# Patient Record
Sex: Female | Born: 1985 | Race: White | Hispanic: No | Marital: Married | Smoking: Never smoker
Health system: Southern US, Community
[De-identification: ages and names within clinical notes are randomized; demographics above are authoritative.]

## PROBLEM LIST (undated history)

## (undated) DIAGNOSIS — R55 Syncope and collapse: Secondary | ICD-10-CM

---

## 2017-09-01 ENCOUNTER — Emergency Department (HOSPITAL_BASED_OUTPATIENT_CLINIC_OR_DEPARTMENT_OTHER)
Admission: EM | Admit: 2017-09-01 | Discharge: 2017-09-01 | Disposition: A | Discharge status: Home / Self Care | Payer: BLUE CROSS/BLUE SHIELD | Attending: Emergency Medicine | Admitting: Emergency Medicine

## 2017-09-01 ENCOUNTER — Other Ambulatory Visit: Payer: Self-pay

## 2017-09-01 ENCOUNTER — Encounter (HOSPITAL_BASED_OUTPATIENT_CLINIC_OR_DEPARTMENT_OTHER): Payer: Self-pay | Admitting: Emergency Medicine

## 2017-09-01 DIAGNOSIS — R55 Syncope and collapse: Secondary | ICD-10-CM | POA: Diagnosis not present

## 2017-09-01 DIAGNOSIS — R51 Headache: Secondary | ICD-10-CM | POA: Diagnosis not present

## 2017-09-01 DIAGNOSIS — E876 Hypokalemia: Secondary | ICD-10-CM | POA: Diagnosis not present

## 2017-09-01 DIAGNOSIS — R42 Dizziness and giddiness: Secondary | ICD-10-CM | POA: Diagnosis present

## 2017-09-01 HISTORY — DX: Syncope and collapse: R55

## 2017-09-01 LAB — BASIC METABOLIC PANEL WITH GFR
Anion gap: 5 (ref 5–15)
BUN: 6 mg/dL (ref 6–20)
CO2: 26 mmol/L (ref 22–32)
Calcium: 8.6 mg/dL — ABNORMAL LOW (ref 8.9–10.3)
Chloride: 105 mmol/L (ref 101–111)
Creatinine, Ser: 0.55 mg/dL (ref 0.44–1.00)
GFR calc Af Amer: >60 mL/min
GFR calc non Af Amer: >60 mL/min
Glucose, Bld: 84 mg/dL (ref 65–99)
Potassium: 3.1 mmol/L — ABNORMAL LOW (ref 3.5–5.1)
Sodium: 136 mmol/L (ref 135–145)

## 2017-09-01 LAB — CBC
HEMATOCRIT: 37.6 % (ref 36.0–46.0)
Hemoglobin: 12.9 g/dL (ref 12.0–15.0)
MCH: 28.7 pg (ref 26.0–34.0)
MCHC: 34.3 g/dL (ref 30.0–36.0)
MCV: 83.6 fL (ref 78.0–100.0)
Platelets: 150 10*3/uL (ref 150–400)
RBC: 4.5 MIL/uL (ref 3.87–5.11)
RDW: 13.1 % (ref 11.5–15.5)
WBC: 3.1 10*3/uL — ABNORMAL LOW (ref 4.0–10.5)

## 2017-09-01 LAB — PREGNANCY, URINE: Preg Test, Ur: NEGATIVE

## 2017-09-01 MED ORDER — POTASSIUM CHLORIDE CRYS ER 20 MEQ PO TBCR: 40.0000 meq | EXTENDED_RELEASE_TABLET | Freq: Once | ORAL | Status: DC

## 2017-09-01 NOTE — ED Triage Notes (Signed)
Pt states she has been having some dizziness episodes for several days, near syncopal episode two days ago.  Pt states she feels fine today but want to have it checked out.

## 2017-09-01 NOTE — Discharge Instructions (Signed)
Follow-up with a primary care doctor for further evaluation as we discussed, return as needed for worsening symptoms, try eating foods that are high in potassium

## 2017-09-01 NOTE — ED Notes (Signed)
Pt left department prior to potassium administration.  Md notified.  Called in one dose of potassium to [facility] on [address].  Pt will pick up

## 2017-09-01 NOTE — ED Provider Notes (Signed)
[facility] EMERGENCY DEPARTMENT Provider Note   CSN: [id number] Arrival date & time: 09/01/17  [id number]     History   Chief Complaint Chief Complaint  Patient presents with  . Dizziness    HPI Teresa Baker is a 31 y.o. female.  HPI Patient presents to the emergency room for evaluation of dizziness.  Patient states she had an episode a couple of days ago.  She was at work.  She was feeling somewhat stressed.  She started to feel lightheaded as if she was going to pass out.  Patient's had these symptoms in the past when she was younger so she was not too concerned initially.  She sat down and waited for the symptoms to pass.  What was unusual is that she developed a headache after the episode and it took longer for her symptoms to resolve.  She did end up feeling better and was doing well for the last couple of days.  She did have another where she felt lightheaded today so she decided to get checked out.  Patient also notes that she took her blood pressure during the episode and it was elevated at 160 systolic.  Patient went to an urgent care to get evaluated today and they suggested she come to the emergency room she denies any trouble with headache numbness or weakness.  She is not having any fevers or chills.  No vomiting or diarrhea. Past Medical History:  Diagnosis Date  . Fainting     There are no active problems to display for this patient.   Social history: Patient denies drug use  OB History    No data available       Home Medications    Prior to Admission medications   Not on File    Family History Family History  Problem Relation Age of Onset  . Hypertension Father     Social History Social History   Tobacco Use  . Smoking status: Never Smoker  . Smokeless tobacco: Never Used  Substance Use Topics  . Alcohol use: Not on file  . Drug use: Not on file     Allergies   Phenergan [promethazine hcl]   Review of Systems Review of Systems    All other systems reviewed and are negative.    Physical Exam Updated Vital Signs BP (!) 121/94   Pulse 87   Temp 97.8 F (36.6 C)   Resp 16   Ht 1.524 m (5')   Wt 56.7 kg (125 lb)   LMP 08/27/2017   SpO2 100%   BMI 24.41 kg/m   Physical Exam  Constitutional: She appears well-developed and well-nourished. No distress.  HENT:  Head: Normocephalic and atraumatic.  Right Ear: External ear normal.  Left Ear: External ear normal.  Eyes: Conjunctivae are normal. Right eye exhibits no discharge. Left eye exhibits no discharge. No scleral icterus.  Neck: Neck supple. No tracheal deviation present.  Cardiovascular: Normal rate, regular rhythm and intact distal pulses.  Pulmonary/Chest: Effort normal and breath sounds normal. No stridor. No respiratory distress. She has no wheezes. She has no rales.  Abdominal: Soft. Bowel sounds are normal. She exhibits no distension. There is no tenderness. There is no rebound and no guarding.  Musculoskeletal: She exhibits no edema or tenderness.  Neurological: She is alert. She has normal strength. No cranial nerve deficit (no facial droop, extraocular movements intact, no slurred speech) or sensory deficit. She exhibits normal muscle tone. She displays no seizure activity. Coordination normal.  Skin: Skin is warm and dry. No rash noted.  Psychiatric: She has a normal mood and affect.  Nursing note and vitals reviewed.    ED Treatments / Results  Labs (all labs ordered are listed, but only abnormal results are displayed) Labs Reviewed  CBC - Abnormal; Notable for the following components:      Result Value   WBC 3.1 (*)    All other components within normal limits  BASIC METABOLIC PANEL - Abnormal; Notable for the following components:   Potassium 3.1 (*)    Calcium 8.6 (*)    All other components within normal limits  PREGNANCY, URINE    EKG  EKG Interpretation  Date/Time:  Friday September 01 2017 09:31:33 EST Ventricular Rate:   78 PR Interval:    QRS Duration: 102 QT Interval:  394 QTC Calculation: 449 R Axis:   90 Text Interpretation:  Sinus rhythm Borderline right axis deviation No old tracing to compare Confirmed by Linwood DibblesKnapp, Landra Howze [phone] on 09/01/2017 9:38:04 AM       Radiology No results found.  Procedures Procedures (including critical care time)  Medications Ordered in ED Medications  potassium chloride SA (K-DUR,KLOR-CON) CR tablet 40 mEq (not administered)     Initial Impression / Assessment and Plan / ED Course  I have reviewed the triage vital signs and the nursing notes.  Pertinent labs & imaging results that were available during my care of the patient were reviewed by me and considered in my medical decision making (see chart for details).   Patient presented to the emergency room for evaluation of a near syncopal episode the other day.  Patient states she has had these symptoms in the past.  Physical exam is unremarkable.  She is asymptomatic.  Her laboratory tests are notable for hypokalemia.  That is most likely not clinically related however I will give her a dose of potassium and have her follow-up with her primary care doctor to have that rechecked.  At this time there does not appear to be any evidence of an acute emergency medical condition and the patient appears stable for discharge with appropriate outpatient follow up.   Final Clinical Impressions(s) / ED Diagnoses   Final diagnoses:  Hypokalemia  Near syncope    ED Discharge Orders    None       Linwood DibblesKnapp, Cobi Delph, MD 09/01/17 1126

## 2019-09-17 ENCOUNTER — Ambulatory Visit: Payer: Managed Care, Other (non HMO) | Attending: Internal Medicine

## 2019-09-17 DIAGNOSIS — Z20822 Contact with and (suspected) exposure to covid-19: Secondary | ICD-10-CM

## 2021-08-09 ENCOUNTER — Ambulatory Visit: Payer: Self-pay

## 2021-08-09 ENCOUNTER — Ambulatory Visit (INDEPENDENT_AMBULATORY_CARE_PROVIDER_SITE_OTHER): Payer: Managed Care, Other (non HMO) | Admitting: Family Medicine

## 2021-08-09 ENCOUNTER — Encounter: Payer: Self-pay | Admitting: Family Medicine

## 2021-08-09 VITALS — BP 108/78 | Ht 60.0 in | Wt 119.0 lb

## 2021-08-09 DIAGNOSIS — S92214A Nondisplaced fracture of cuboid bone of right foot, initial encounter for closed fracture: Secondary | ICD-10-CM | POA: Diagnosis not present

## 2021-08-09 DIAGNOSIS — M25571 Pain in right ankle and joints of right foot: Secondary | ICD-10-CM

## 2021-08-09 NOTE — Patient Instructions (Signed)
Nice to meet you Please use the boot  Please try ice as needed   Please send me a message in MyChart with any questions or updates.  Please see me back in 2 weeks.   --Dr. Jordan Likes

## 2021-08-09 NOTE — Assessment & Plan Note (Signed)
Findings most consistent with a cuboid fracture. -Counseled on home exercise therapy and supportive care. -Cam walker and crutches. -Follow-up in 2 weeks.

## 2021-08-09 NOTE — Progress Notes (Signed)
  Legacy Carrender - 35 y.o. female MRN [id number]  Date of birth: 12/03/1985  SUBJECTIVE:  Including CC & ROS.  No chief complaint on file.   Teresa Baker is a 35 y.o. female that is presenting with acute right foot pain.  She had an inversion injury a few days ago.  Since that time she has had pain and swelling over the lateral midfoot.  Has been using ice.  Independent review of the right foot fracture suggest an avulsion of the cuboid   Review of Systems See HPI   HISTORY: Past Medical, Surgical, Social, and Family History Reviewed & Updated per EMR.   Pertinent Historical Findings include:  Past Medical History:  Diagnosis Date   Fainting     History reviewed. No pertinent surgical history.  Family History  Problem Relation Age of Onset   Hypertension Father     Social History   Socioeconomic History   Marital status: Married    Spouse name: Not on file   Number of children: Not on file   Years of education: Not on file   Highest education level: Not on file  Occupational History   Not on file  Tobacco Use   Smoking status: Never   Smokeless tobacco: Never  Substance and Sexual Activity   Alcohol use: Not on file   Drug use: Not on file   Sexual activity: Not on file  Other Topics Concern   Not on file  Social History Narrative   Not on file   Social Determinants of Health   Financial Resource Strain: Not on file  Food Insecurity: Not on file  Transportation Needs: Not on file  Physical Activity: Not on file  Stress: Not on file  Social Connections: Not on file  Intimate Partner Violence: Not on file     PHYSICAL EXAM:  VS: BP 108/78   Ht 5' (1.524 m)   Wt 119 lb (54 kg)   BMI 23.24 kg/m  Physical Exam Gen: NAD, alert, cooperative with exam, well-appearing   Limited ultrasound: Right foot:  Normal-appearing peroneal tendons at the lateral malleolus. Normal-appearing base of the fifth metatarsal. Normal insertional peroneal brevis base into the  base of the fifth metatarsal. Increased hyperemia over the lateral aspect of the cuboid to suggest fracture.  Summary: Findings consistent with nondisplaced cuboid fracture.  Ultrasound and interpretation by Clare Gandy, MD     ASSESSMENT & PLAN:   Closed nondisplaced fracture of cuboid bone of right foot Findings most consistent with a cuboid fracture. -Counseled on home exercise therapy and supportive care. -Cam walker and crutches. -Follow-up in 2 weeks.

## 2021-08-23 ENCOUNTER — Ambulatory Visit: Payer: Managed Care, Other (non HMO) | Admitting: Family Medicine

## 2021-08-24 ENCOUNTER — Ambulatory Visit (INDEPENDENT_AMBULATORY_CARE_PROVIDER_SITE_OTHER): Payer: Managed Care, Other (non HMO) | Admitting: Family Medicine

## 2021-08-24 ENCOUNTER — Encounter: Payer: Self-pay | Admitting: Family Medicine

## 2021-08-24 ENCOUNTER — Ambulatory Visit (HOSPITAL_BASED_OUTPATIENT_CLINIC_OR_DEPARTMENT_OTHER)
Admission: RE | Admit: 2021-08-24 | Discharge: 2021-08-24 | Disposition: A | Discharge status: Home / Self Care | Payer: Managed Care, Other (non HMO) | Source: Ambulatory Visit | Attending: Family Medicine | Admitting: Family Medicine

## 2021-08-24 ENCOUNTER — Other Ambulatory Visit: Payer: Self-pay

## 2021-08-24 DIAGNOSIS — S92214D Nondisplaced fracture of cuboid bone of right foot, subsequent encounter for fracture with routine healing: Secondary | ICD-10-CM | POA: Insufficient documentation

## 2021-08-24 NOTE — Patient Instructions (Signed)
Good to see you Please continue the boot  Please use ice as needed  I will call with the results   Please send me a message in MyChart with any questions or updates.  Please see me back in 4 weeks.   --Dr. Jordan Likes

## 2021-08-24 NOTE — Assessment & Plan Note (Addendum)
Having improvement with CAM walker -Counseled on home exercise therapy and supportive care. -X-ray. -Could consider physical therapy.

## 2021-08-24 NOTE — Progress Notes (Signed)
  Teresa Baker - 35 y.o. female MRN [id number]  Date of birth: 09-15-1986  SUBJECTIVE:  Including CC & ROS.  No chief complaint on file.   Teresa Baker is a 35 y.o. female that is following up for her right foot fracture.  She has been doing well with the cam walker.  Denies any significant swelling and only minimal pain.   Review of Systems See HPI   HISTORY: Past Medical, Surgical, Social, and Family History Reviewed & Updated per EMR.   Pertinent Historical Findings include:  Past Medical History:  Diagnosis Date   Fainting     History reviewed. No pertinent surgical history.  Family History  Problem Relation Age of Onset   Hypertension Father     Social History   Socioeconomic History   Marital status: Married    Spouse name: Not on file   Number of children: Not on file   Years of education: Not on file   Highest education level: Not on file  Occupational History   Not on file  Tobacco Use   Smoking status: Never   Smokeless tobacco: Never  Substance and Sexual Activity   Alcohol use: Not on file   Drug use: Not on file   Sexual activity: Not on file  Other Topics Concern   Not on file  Social History Narrative   Not on file   Social Determinants of Health   Financial Resource Strain: Not on file  Food Insecurity: Not on file  Transportation Needs: Not on file  Physical Activity: Not on file  Stress: Not on file  Social Connections: Not on file  Intimate Partner Violence: Not on file     PHYSICAL EXAM:  VS: BP 98/60 (BP Location: Left Arm, Patient Position: Sitting)   Ht 5' (1.524 m)   Wt 119 lb (54 kg)   BMI 23.24 kg/m  Physical Exam Gen: NAD, alert, cooperative with exam, well-appearing    ASSESSMENT & PLAN:   Closed nondisplaced fracture of cuboid bone of right foot Having improvement with CAM walker -Counseled on home exercise therapy and supportive care. -X-ray. -Could consider physical therapy.

## 2021-08-25 ENCOUNTER — Telehealth: Payer: Self-pay | Admitting: Family Medicine

## 2021-08-25 NOTE — Telephone Encounter (Signed)
Pt informed of below.  

## 2021-08-25 NOTE — Telephone Encounter (Signed)
Left VM for patient. If she calls back please have her speak with a nurse/CMA and inform that the xrays shows the fracture so continue with CAM walker for 4 weeks. .   If any questions then please take the best time and phone number to call and I will try to call her back.   Myra Rude, MD [facility] 08/25/2021, 2:17 PM

## 2021-09-21 ENCOUNTER — Ambulatory Visit (INDEPENDENT_AMBULATORY_CARE_PROVIDER_SITE_OTHER): Payer: Managed Care, Other (non HMO) | Admitting: Family Medicine

## 2021-09-21 ENCOUNTER — Encounter: Payer: Self-pay | Admitting: Family Medicine

## 2021-09-21 DIAGNOSIS — S92214D Nondisplaced fracture of cuboid bone of right foot, subsequent encounter for fracture with routine healing: Secondary | ICD-10-CM

## 2021-09-21 NOTE — Patient Instructions (Signed)
Good to see you Please use ice as needed  Please stop the boot  Please try the exercises   Please send me a message in MyChart with any questions or updates.  Please see me back as needed .   --Dr. Raeford Razor

## 2021-09-21 NOTE — Progress Notes (Signed)
°  Teresa Baker - 36 y.o. female MRN [id number]  Date of birth: 07-14-1986  SUBJECTIVE:  Including CC & ROS.  No chief complaint on file.   Teresa Baker is a 36 y.o. female that is following up for her right foot fracture.  She has discontinued the boot and the foot has been doing well.  Denies any pain or swelling today.   Review of Systems See HPI   HISTORY: Past Medical, Surgical, Social, and Family History Reviewed & Updated per EMR.   Pertinent Historical Findings include:  Past Medical History:  Diagnosis Date   Fainting     History reviewed. No pertinent surgical history.  Family History  Problem Relation Age of Onset   Hypertension Father     Social History   Socioeconomic History   Marital status: Married    Spouse name: Not on file   Number of children: Not on file   Years of education: Not on file   Highest education level: Not on file  Occupational History   Not on file  Tobacco Use   Smoking status: Never   Smokeless tobacco: Never  Substance and Sexual Activity   Alcohol use: Not on file   Drug use: Not on file   Sexual activity: Not on file  Other Topics Concern   Not on file  Social History Narrative   Not on file   Social Determinants of Health   Financial Resource Strain: Not on file  Food Insecurity: Not on file  Transportation Needs: Not on file  Physical Activity: Not on file  Stress: Not on file  Social Connections: Not on file  Intimate Partner Violence: Not on file     PHYSICAL EXAM:  VS: BP 110/68 (BP Location: Left Arm, Patient Position: Sitting)    Ht 5' (1.524 m)    Wt 119 lb (54 kg)    BMI 23.24 kg/m  Physical Exam Gen: NAD, alert, cooperative with exam, well-appearing     ASSESSMENT & PLAN:   Closed nondisplaced fracture of cuboid bone of right foot Initial injury prior to 11/21.  No pain today -Counseled on home exercise therapy and supportive care. -Counseled on discontinuing the cam walker. -Can follow-up as  needed.

## 2021-09-21 NOTE — Assessment & Plan Note (Signed)
Initial injury prior to 11/21.  No pain today -Counseled on home exercise therapy and supportive care. -Counseled on discontinuing the cam walker. -Can follow-up as needed.

## 2023-01-02 ENCOUNTER — Encounter: Payer: Self-pay | Admitting: *Deleted

## 2023-01-23 IMAGING — DX DG FOOT COMPLETE 3+V*R*
3 series · 3 of 3 positions shown · non-contrast
Comparison: None.

CLINICAL DATA: Foot pain post injury

EXAM:
RIGHT FOOT COMPLETE - 3+ VIEW

[foot ap]
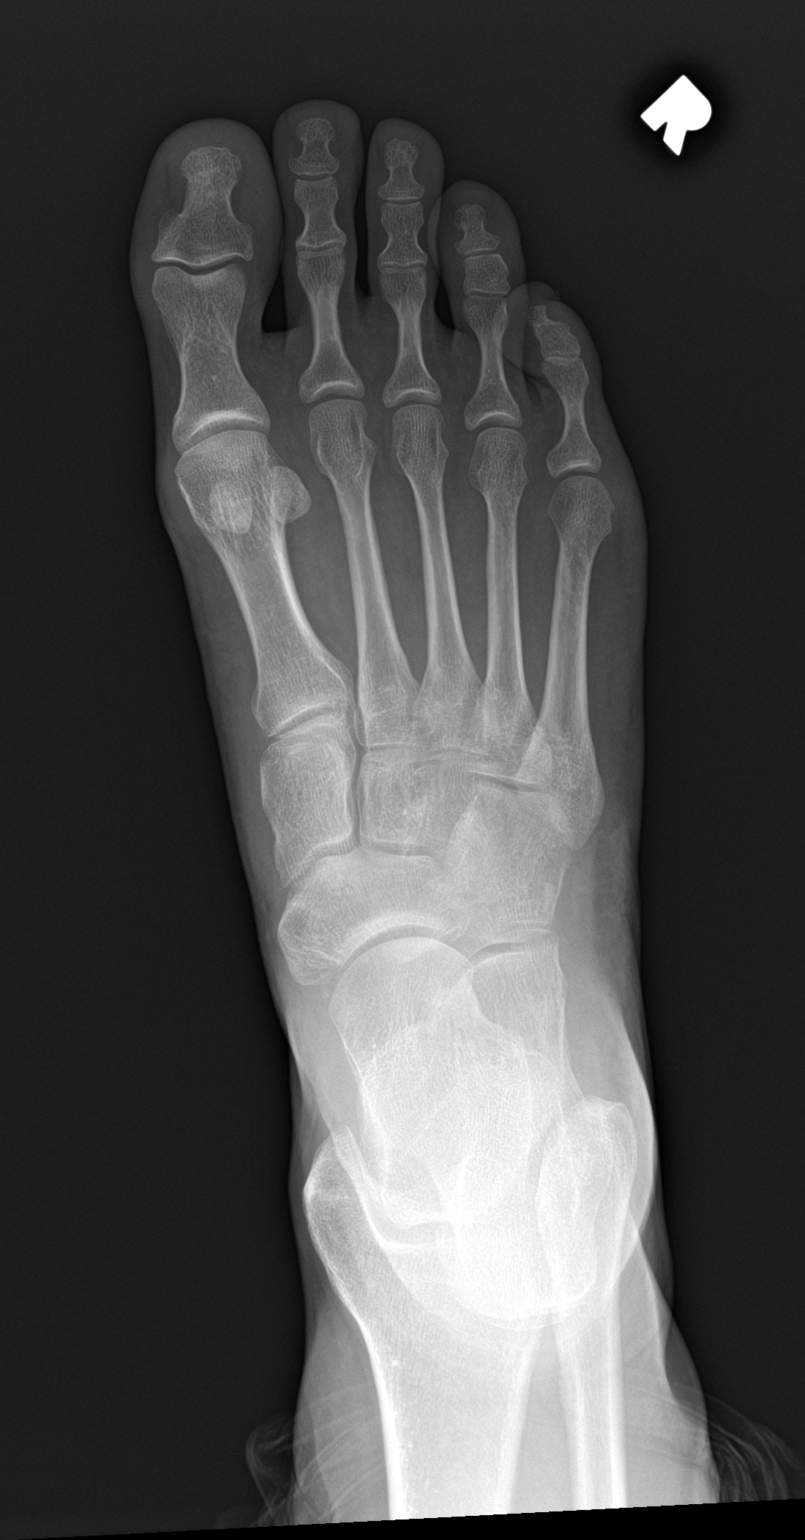

[foot obl]
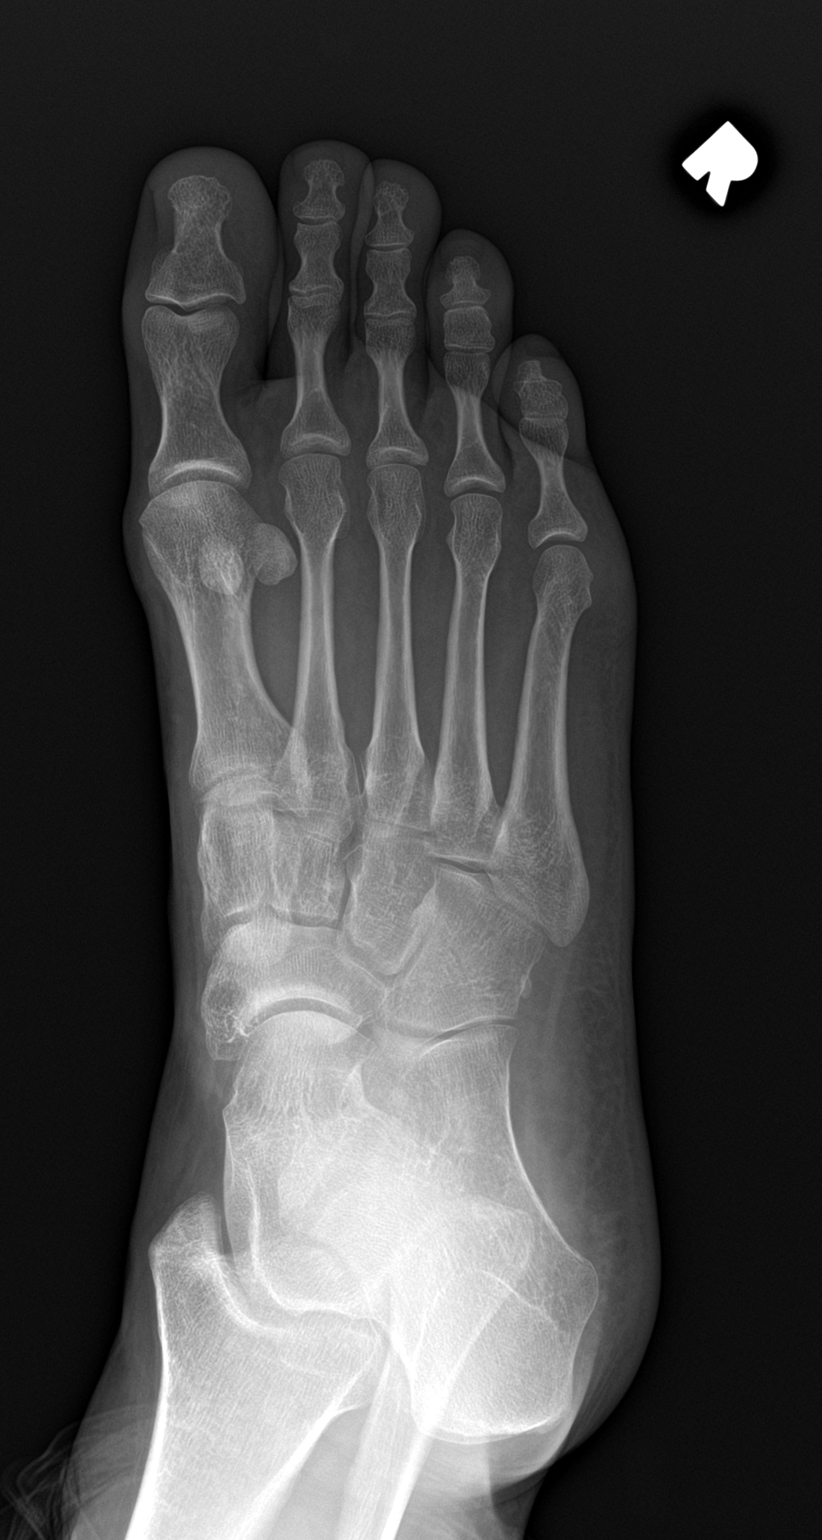

[foot lat]
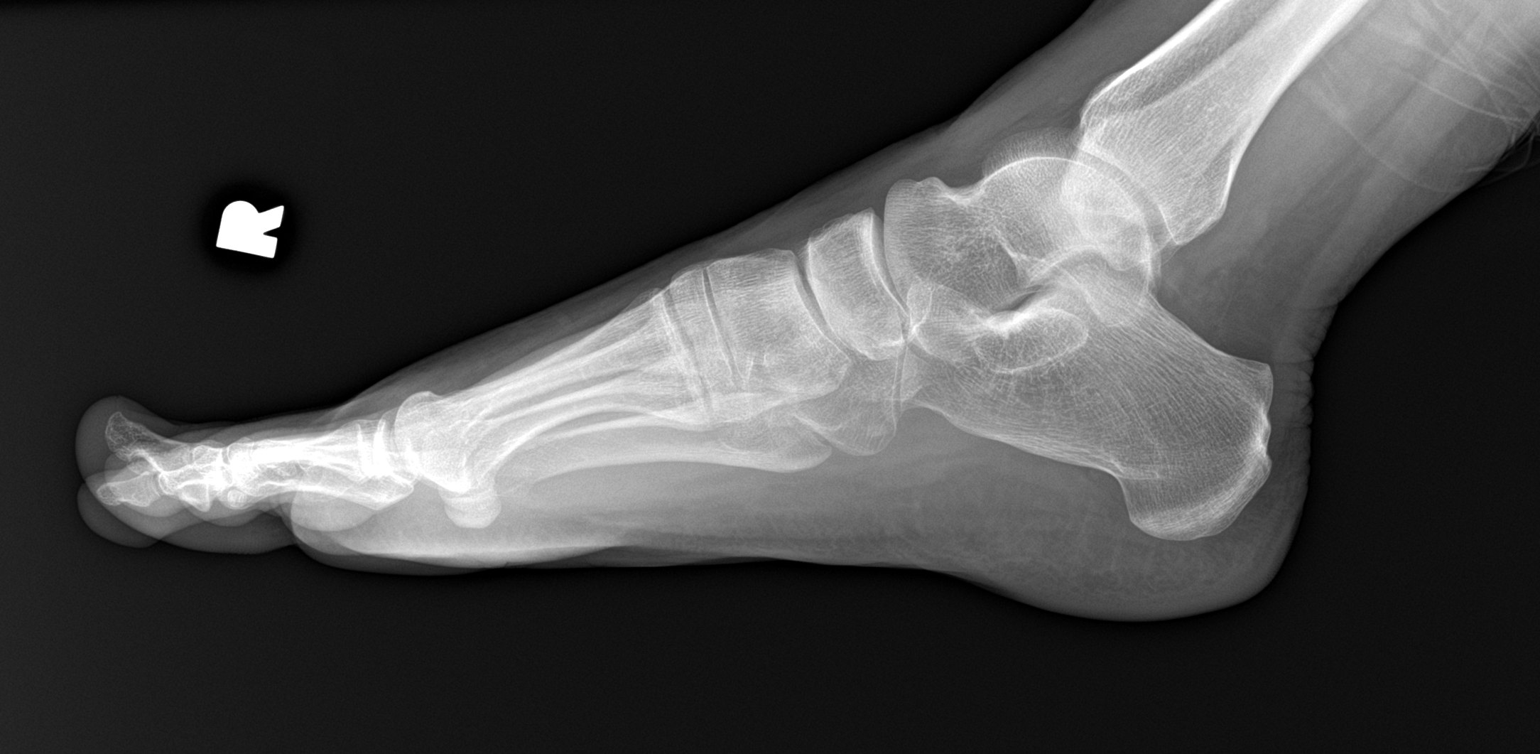

[3 of 3 positions shown; findings below may reference images not displayed]

FINDINGS: No priors are available for comparison. No malalignment. Faint
linear lucencies within the cuboid bone may correspond to history of
nondisplaced cuboid fracture. No periosteal reaction. There is some
adjacent soft tissue swelling
IMPRESSION: Faintly visible linear lucencies in the cuboid bone presumably
corresponding to history of nondisplaced cuboid fracture.
